# Patient Record
Sex: Female | Born: 1935 | Race: White | Hispanic: No | Marital: Married | Smoking: Never smoker
Health system: Southern US, Community
[De-identification: ages and names within clinical notes are randomized; demographics above are authoritative.]

## PROBLEM LIST (undated history)

## (undated) DIAGNOSIS — E119 Type 2 diabetes mellitus without complications: Secondary | ICD-10-CM

## (undated) DIAGNOSIS — R42 Dizziness and giddiness: Secondary | ICD-10-CM

## (undated) DIAGNOSIS — E7800 Pure hypercholesterolemia, unspecified: Secondary | ICD-10-CM

## (undated) DIAGNOSIS — Z8619 Personal history of other infectious and parasitic diseases: Secondary | ICD-10-CM

## (undated) DIAGNOSIS — F329 Major depressive disorder, single episode, unspecified: Secondary | ICD-10-CM

## (undated) DIAGNOSIS — F411 Generalized anxiety disorder: Secondary | ICD-10-CM

## (undated) DIAGNOSIS — I10 Essential (primary) hypertension: Secondary | ICD-10-CM

## (undated) DIAGNOSIS — I429 Cardiomyopathy, unspecified: Secondary | ICD-10-CM

## (undated) DIAGNOSIS — M1990 Unspecified osteoarthritis, unspecified site: Secondary | ICD-10-CM

## (undated) DIAGNOSIS — M797 Fibromyalgia: Secondary | ICD-10-CM

## (undated) DIAGNOSIS — E669 Obesity, unspecified: Secondary | ICD-10-CM

## (undated) DIAGNOSIS — R053 Chronic cough: Secondary | ICD-10-CM

## (undated) DIAGNOSIS — I340 Nonrheumatic mitral (valve) insufficiency: Secondary | ICD-10-CM

## (undated) DIAGNOSIS — J301 Allergic rhinitis due to pollen: Secondary | ICD-10-CM

## (undated) DIAGNOSIS — I4891 Unspecified atrial fibrillation: Secondary | ICD-10-CM

## (undated) DIAGNOSIS — I509 Heart failure, unspecified: Secondary | ICD-10-CM

## (undated) DIAGNOSIS — M8580 Other specified disorders of bone density and structure, unspecified site: Secondary | ICD-10-CM

## (undated) DIAGNOSIS — H02149 Spastic ectropion of unspecified eye, unspecified eyelid: Secondary | ICD-10-CM

## (undated) DIAGNOSIS — K219 Gastro-esophageal reflux disease without esophagitis: Secondary | ICD-10-CM

## (undated) DIAGNOSIS — M159 Polyosteoarthritis, unspecified: Secondary | ICD-10-CM

## (undated) DIAGNOSIS — R05 Cough: Secondary | ICD-10-CM

## (undated) HISTORY — DX: Obesity, unspecified: E66.9

## (undated) HISTORY — DX: Major depressive disorder, single episode, unspecified: F32.9

## (undated) HISTORY — DX: Pure hypercholesterolemia, unspecified: E78.00

## (undated) HISTORY — DX: Gastro-esophageal reflux disease without esophagitis: K21.9

## (undated) HISTORY — DX: Generalized anxiety disorder: F41.1

## (undated) HISTORY — DX: Type 2 diabetes mellitus without complications: E11.9

## (undated) HISTORY — DX: Heart failure, unspecified: I50.9

## (undated) HISTORY — DX: Cardiomyopathy, unspecified: I42.9

## (undated) HISTORY — DX: Nonrheumatic mitral (valve) insufficiency: I34.0

## (undated) HISTORY — DX: Unspecified osteoarthritis, unspecified site: M19.90

## (undated) HISTORY — DX: Cough: R05

## (undated) HISTORY — PX: TONSILLECTOMY: SUR1361

## (undated) HISTORY — PX: PARTIAL HYSTERECTOMY: SHX80

## (undated) HISTORY — DX: Spastic ectropion of unspecified eye, unspecified eyelid: H02.149

## (undated) HISTORY — DX: Dizziness and giddiness: R42

## (undated) HISTORY — DX: Chronic cough: R05.3

## (undated) HISTORY — DX: Polyosteoarthritis, unspecified: M15.9

## (undated) HISTORY — DX: Other specified disorders of bone density and structure, unspecified site: M85.80

## (undated) HISTORY — DX: Personal history of other infectious and parasitic diseases: Z86.19

## (undated) HISTORY — DX: Allergic rhinitis due to pollen: J30.1

## (undated) HISTORY — DX: Fibromyalgia: M79.7

## (undated) HISTORY — DX: Unspecified atrial fibrillation: I48.91

## (undated) HISTORY — DX: Essential (primary) hypertension: I10

---

## 2000-02-10 ENCOUNTER — Encounter: Payer: Self-pay | Admitting: Family Medicine

## 2000-02-10 ENCOUNTER — Encounter: Admission: RE | Admit: 2000-02-10 | Discharge: 2000-02-10 | Payer: Self-pay | Admitting: Family Medicine

## 2001-08-13 ENCOUNTER — Encounter: Payer: Self-pay | Admitting: Internal Medicine

## 2001-08-13 ENCOUNTER — Encounter: Admission: RE | Admit: 2001-08-13 | Discharge: 2001-08-13 | Payer: Self-pay | Admitting: Internal Medicine

## 2001-10-04 HISTORY — PX: X-STOP IMPLANTATION: SHX2677

## 2001-10-12 ENCOUNTER — Encounter: Payer: Self-pay | Admitting: Specialist

## 2001-10-19 ENCOUNTER — Inpatient Hospital Stay (HOSPITAL_COMMUNITY): Admission: RE | Admit: 2001-10-19 | Discharge: 2001-10-24 | Payer: Self-pay | Admitting: Specialist

## 2001-10-19 ENCOUNTER — Encounter: Payer: Self-pay | Admitting: Specialist

## 2002-10-11 ENCOUNTER — Ambulatory Visit (HOSPITAL_COMMUNITY): Admission: RE | Admit: 2002-10-11 | Discharge: 2002-10-11 | Payer: Self-pay | Admitting: Gastroenterology

## 2003-08-14 ENCOUNTER — Inpatient Hospital Stay (HOSPITAL_COMMUNITY): Admission: RE | Admit: 2003-08-14 | Discharge: 2003-08-16 | Payer: Self-pay | Admitting: Obstetrics and Gynecology

## 2003-08-14 ENCOUNTER — Encounter (INDEPENDENT_AMBULATORY_CARE_PROVIDER_SITE_OTHER): Payer: Self-pay | Admitting: *Deleted

## 2005-06-05 ENCOUNTER — Encounter: Admission: RE | Admit: 2005-06-05 | Discharge: 2005-06-05 | Payer: Self-pay | Admitting: Family Medicine

## 2006-01-21 ENCOUNTER — Encounter: Admission: RE | Admit: 2006-01-21 | Discharge: 2006-02-16 | Payer: Self-pay | Admitting: Orthopedic Surgery

## 2006-02-17 ENCOUNTER — Encounter: Admission: RE | Admit: 2006-02-17 | Discharge: 2006-05-03 | Payer: Self-pay | Admitting: Orthopedic Surgery

## 2006-06-08 ENCOUNTER — Encounter: Admission: RE | Admit: 2006-06-08 | Discharge: 2006-06-08 | Payer: Self-pay | Admitting: Family Medicine

## 2006-06-24 ENCOUNTER — Ambulatory Visit (HOSPITAL_COMMUNITY): Admission: RE | Admit: 2006-06-24 | Discharge: 2006-06-24 | Payer: Self-pay | Admitting: Interventional Cardiology

## 2006-09-23 ENCOUNTER — Ambulatory Visit (HOSPITAL_COMMUNITY): Admission: RE | Admit: 2006-09-23 | Discharge: 2006-09-24 | Payer: Self-pay | Admitting: Orthopedic Surgery

## 2006-09-27 ENCOUNTER — Encounter: Admission: RE | Admit: 2006-09-27 | Discharge: 2006-12-26 | Payer: Self-pay | Admitting: Orthopedic Surgery

## 2007-01-17 ENCOUNTER — Encounter: Admission: RE | Admit: 2007-01-17 | Discharge: 2007-04-06 | Payer: Self-pay | Admitting: Orthopedic Surgery

## 2007-11-28 ENCOUNTER — Encounter: Admission: RE | Admit: 2007-11-28 | Discharge: 2007-11-28 | Payer: Self-pay | Admitting: Family Medicine

## 2008-01-19 ENCOUNTER — Encounter: Admission: RE | Admit: 2008-01-19 | Discharge: 2008-01-19 | Payer: Self-pay | Admitting: Family Medicine

## 2008-02-15 ENCOUNTER — Encounter: Admission: RE | Admit: 2008-02-15 | Discharge: 2008-02-15 | Payer: Self-pay | Admitting: Family Medicine

## 2008-12-19 ENCOUNTER — Encounter: Admission: RE | Admit: 2008-12-19 | Discharge: 2008-12-19 | Payer: Self-pay | Admitting: Family Medicine

## 2009-09-02 IMAGING — CT CT ABDOMEN W/ CM
2 of 5 series · 17 of 46 positions shown, 19 images · IV contrast (READICAT/WATER & [ID] OMNI 300)
Comparison: None

CT ABDOMEN

CLINICAL DATA: Weight loss, nausea, vomiting, decreased appetite

CT ABDOMEN AND PELVIS WITH CONTRAST
TECHNIQUE: Multidetector CT imaging of the abdomen and pelvis was
performed using the standard protocol following bolus
administration of intravenous contrast.
Contrast: 100 ml Cmnipaque-JEE

[Series 3: routine abdomen · axial · 0.79mm/px · z∈[-364,+26]mm · 14 of 86 slices shown, 16 images]
[im 5/86  soft-tissue]
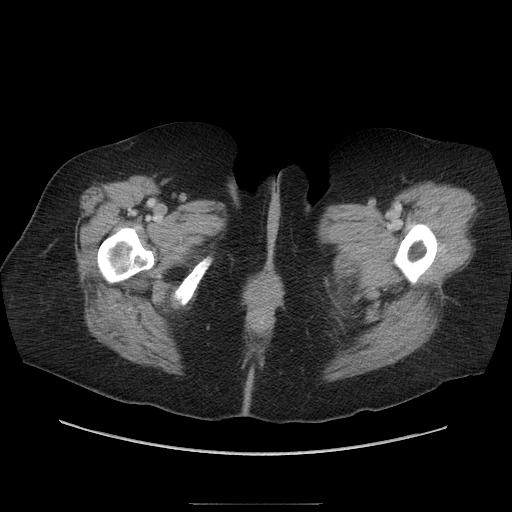
[im 5/86  bone]
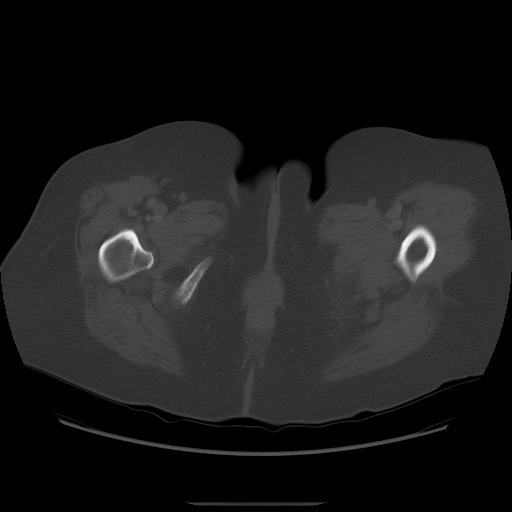
[im 10/86  soft-tissue]
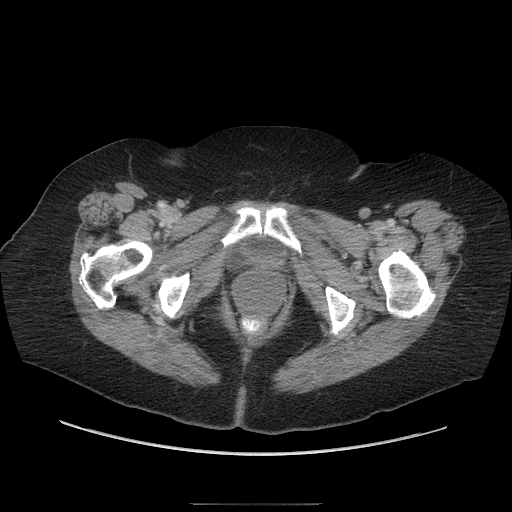
[im 19/86  soft-tissue]
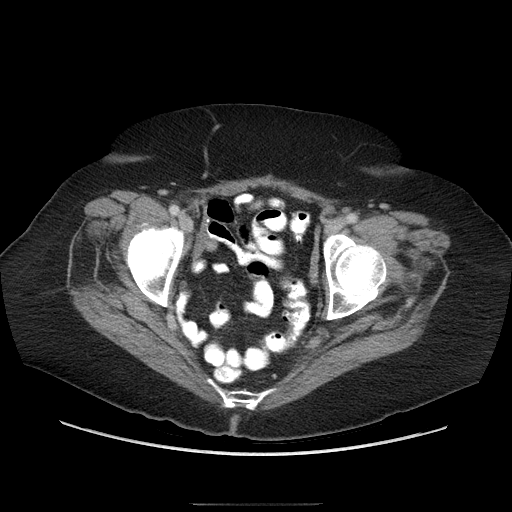
[im 24/86  soft-tissue]
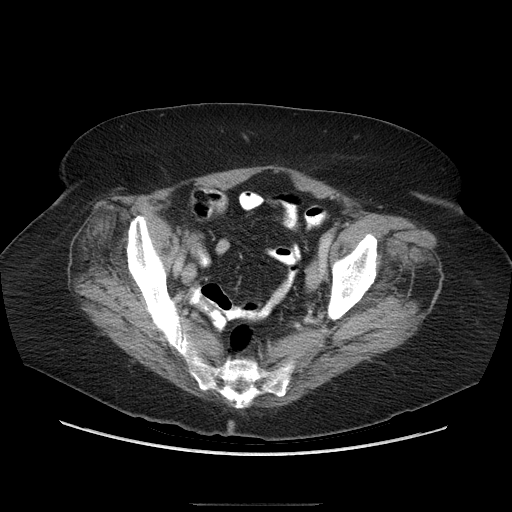
[im 29/86  soft-tissue]
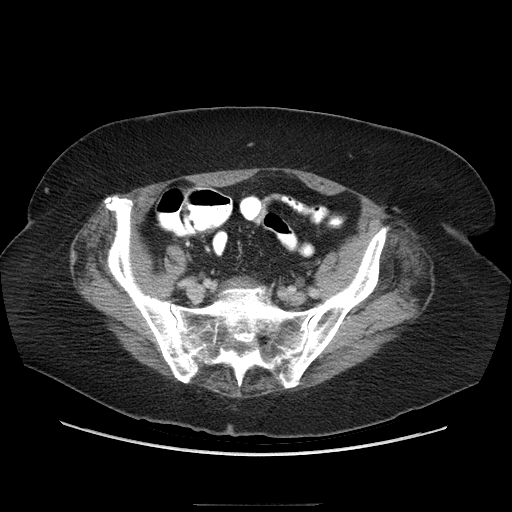
[im 34/86  soft-tissue]
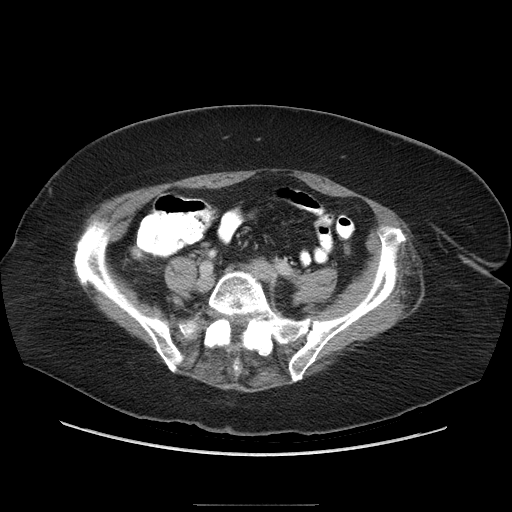
[im 38/86  soft-tissue]
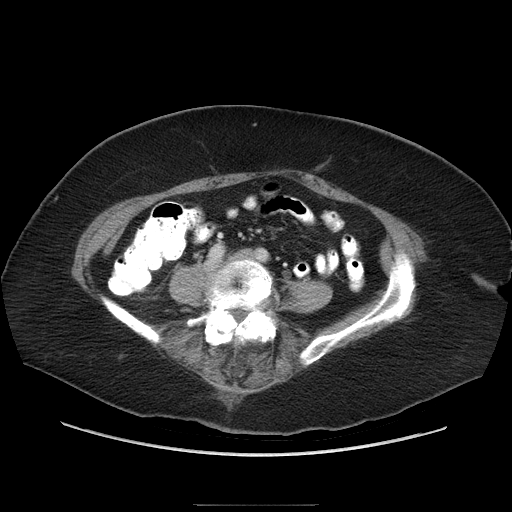
[im 48/86  soft-tissue]
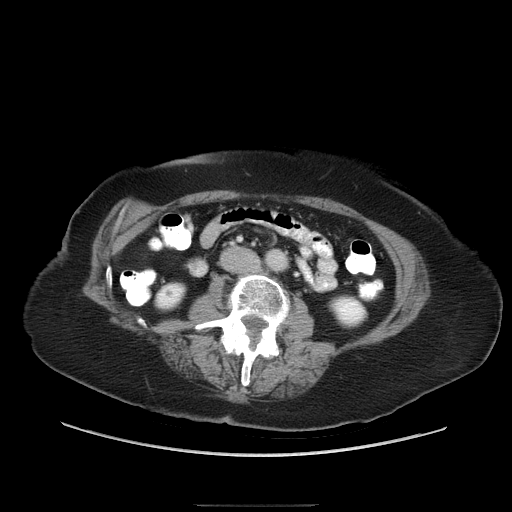
[im 52/86  soft-tissue]
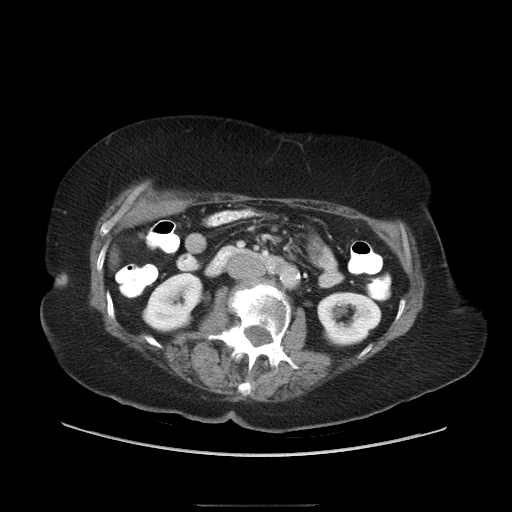
[im 52/86  bone]
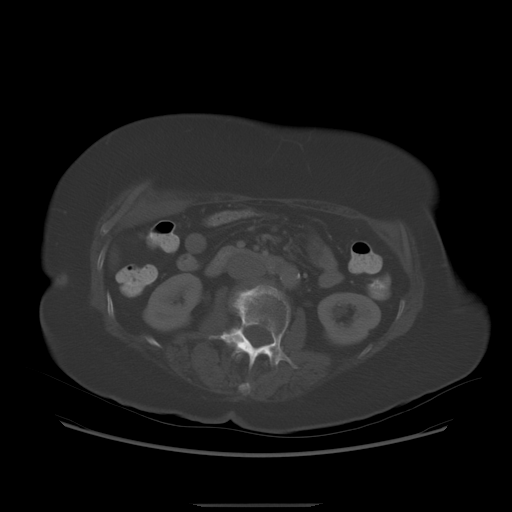
[im 57/86  soft-tissue]
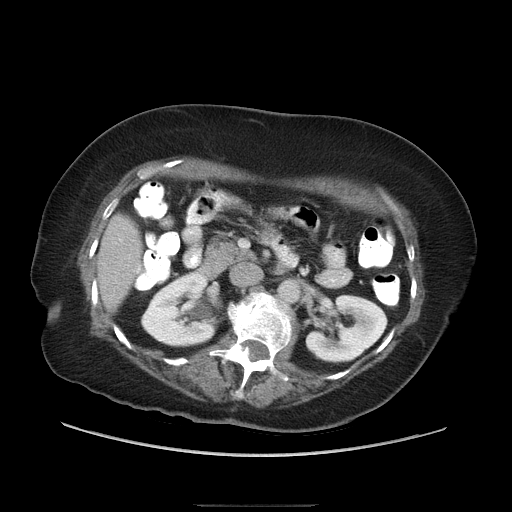
[im 62/86  soft-tissue]
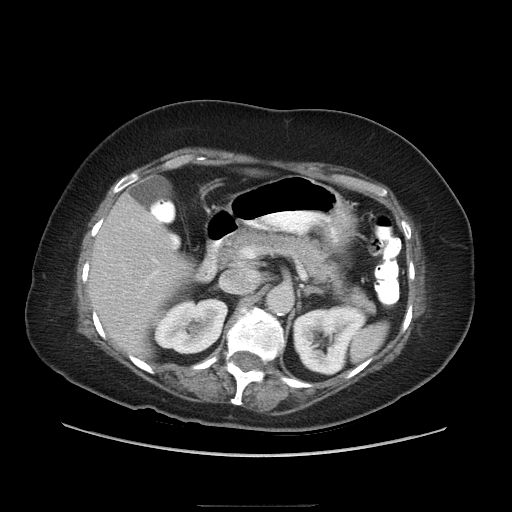
[im 67/86  soft-tissue]
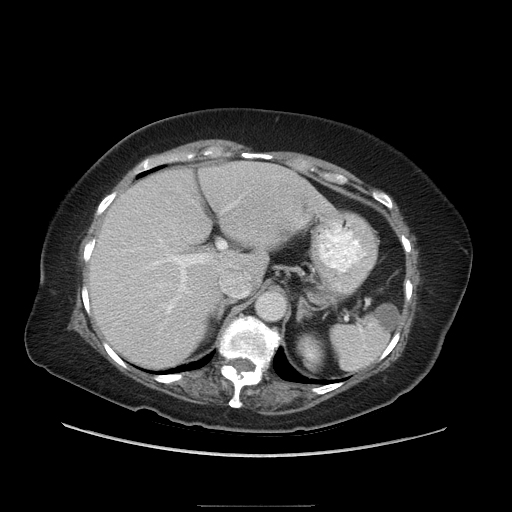
[im 76/86  soft-tissue]
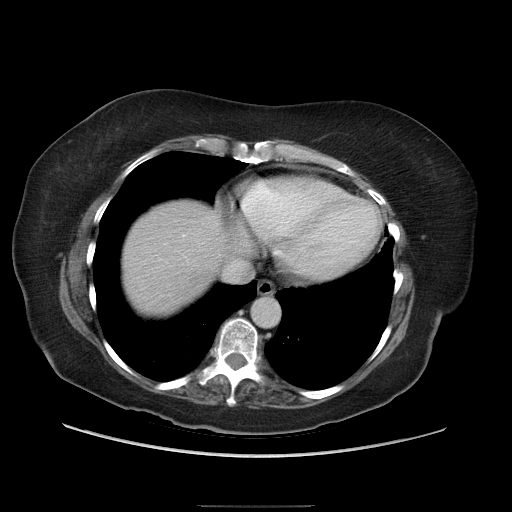
[im 81/86  soft-tissue]
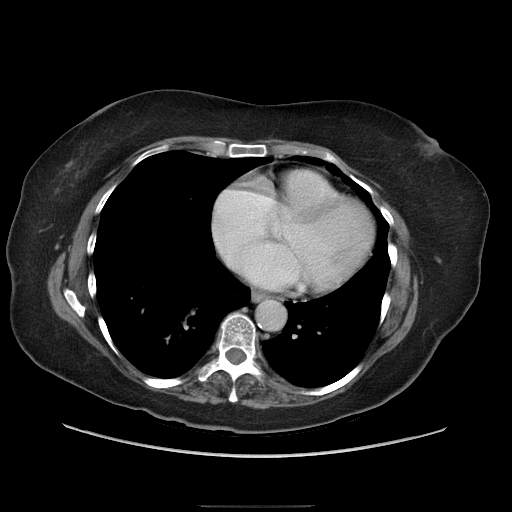

[Series 602: sagittal body · sagittal · 0.89mm/px · 3 of 163 slices shown]
[im 55/163  soft-tissue]
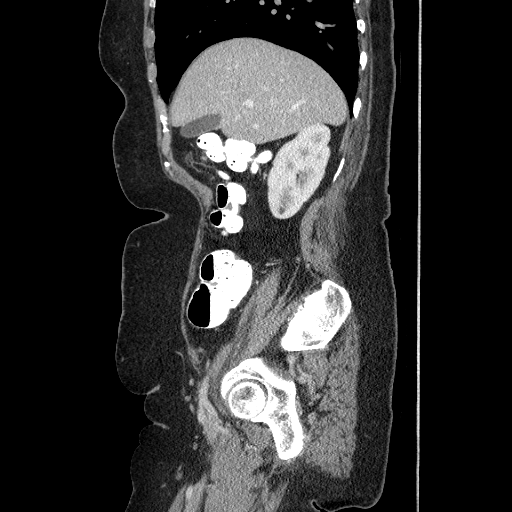
[im 73/163  soft-tissue]
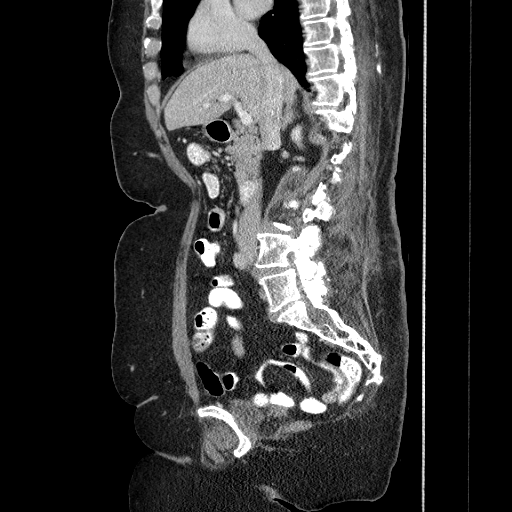
[im 91/163  soft-tissue]
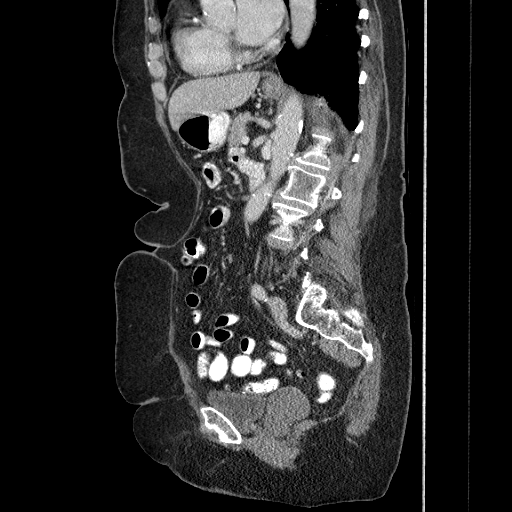

[17 of 46 positions shown; findings below may reference images not displayed]

FINDINGS: The lung bases are clear.  A small slightly dense nodule
is noted deep in the axillary tail of the right breast and
correlation with mammography is recommended.  The liver enhances
with no focal abnormality and no ductal dilatation is seen.  No
calcified gallstones are noted.  The pancreas is normal in size and
the pancreatic duct is not dilated.  The adrenal glands and spleen
appear normal although there is a low attenuation rounded lesion
emanating from the spleen most consistent with benign process such
as complex cyst.  The kidneys enhance and on delayed images the
pelvocaliceal systems appear normal.  Small left parapelvic renal
cysts are noted.  The abdominal aorta is normal in caliber.
IMPRESSION: 1.  No acute abnormality.
2.  Small dense nodule deep in the axillary tail of the right
breast.  Correlate with mammography.
3.  Probable incidental complex splenic cyst.

CT PELVIS
FINDINGS: The uterus is normal in size.  The urinary bladder is
unremarkable.  No pelvic mass or fluid is seen.  There are a few
sigmoid colon diverticula present but no diverticulitis is seen.
The terminal ileum appears normal.  The appendix is not definitely
seen.  Lumbar scoliosis is noted with diffuse degenerative disc
disease.
IMPRESSION: No mass, fluid, or adenopathy.  Diffuse degenerative disc disease
throughout the lumbar spine.  Sigmoid colon diverticula, but no
diverticulitis.

## 2010-08-19 NOTE — Op Note (Signed)
Brooke [name], Brooke [name]               ACCOUNT NO.:  [id number]   MEDICAL RECORD NO.:  [id number]          PATIENT TYPE:  OIB   LOCATION:  5040                         FACILITY:  [facility]   PHYSICIAN:  Almedia Balls. Ranell Patrick, M.D. DATE OF BIRTH:  [birth year]   DATE OF PROCEDURE:  09/23/2006  DATE OF DISCHARGE:                               OPERATIVE REPORT   PREOPERATIVE DIAGNOSES:  1. Right shoulder rotator cuff tear.  2. Right shoulder subscapularis tear.   POSTOPERATIVE DIAGNOSES:  1. Right shoulder rotator cuff tear, chronic.  2. Right shoulder subscapularis tear, chronic.  3. Superior labral tear anterior to posterior with chronic biceps      rupture.  4. Osteoarthritis of glenohumeral joint.   PROCEDURE PERFORMED:  Right shoulder arthroscopy with extensive  intraarticular debridement of torn superior labrum anterior to posterior  with debridement of chronic tearing of the rotator cuff as well as  chondroplasty on the glenohumeral joint.  We did a limited subacromial  bursectomy but no acromioplasty was performed and then we did a mini  open rotator cuff repair, which is partial and a mini open subscapularis  repair, partial.   SURGEON:  Almedia Balls. Ranell Patrick, M.D.   ASSISTANT:  Donnie Coffin. Durwin Nora, P.A.   General anesthesia was used plus interscalene block.   ESTIMATED BLOOD LOSS:  Minimal.   FLUID REPLACEMENT:  1,200 mL crystalloid.   INSTRUMENT COUNTS:  Correct.   COMPLICATIONS:  None.   Preoperative antibiotics were given.   INDICATION:  The patient is a 77 year old female who suffered a fall  onto her right shoulder in a parking garage.  The patient complained of  immediate loss of function and pain in the shoulder.  She has had  minimal complaints of the shoulder prior to this.  On physical  examination, the patient has profound external rotation weakness and MRI  scan demonstrates torn rotator cuff and subscapularis.  The patient  presents now for operative treatment to  attempt to restore function and  continuity of the rotator cuff and subscapularis tendons.  Informed  consent was obtained.   DESCRIPTION OF PROCEDURE:  After an adequate level of anesthesia was  achieved, the patient was positioned supine on the operating room table.  She was brought up in the modified beach chair position.  The right  shoulder was examined under anesthesia, was noted to have excessive  external rotation consistent with subscapularis tear.  She had no  instability in the shoulder.  We sterilely prepped and draped the right  shoulder and then entered the shoulder arthroscopic through standard  arthroscopic portals anterior, posterior and lateral portals.  We  identified quite a bit of damage inside the shoulder including extensive  tearing of the superior labrum.  There was also chronically ruptured  biceps.  We performed a labral debridement using basket forceps and the  motorized shaver.  The patient also had evidence of a full thickness  rotator cuff tear with retraction to the level of the glenoid and the  subscapularis also was torn and I was not sure that we could actually  visualize the subscapularis  through the scope.  Again, the biceps was  torn.  We performed again a labral debridement.  Also some chondroplasty  on the humeral head as there was extensive delamination of cartilage and  full thickness cartilage loss.  We then concluded our glenohumeral  portion of surgery.  Then went in the subacromial space through the  lateral portal identifying again the retracted rotator cuff which did  appear to be mobile somewhat for potential repair.  We performed a  limited bursectomy but did not perform any type of acromioplasty or test  to see the ligament anteriorly.  We then concluded arthroscopy, and went  ahead and made a mini open incision of the raphae between the anterior  and lateral heads of the deltoid.  Dissection carried sharply down  through subcutaneous  tissues.  Split that raphae, identifying the torn  rotator cuff and subscapularis.  We then placed traction sutures in the  end of the tendon, working towards margin convergence and anything we  could do to close down this very large gap, essentially from teres minor  all the way forward to the subscapularis which was off.  There was  prominent greater tuberosity and lesser tuberosity which we rongeured  down to prevent impingement of those structures.  Again, the biceps was  torn and the groove was vacant.  We did identify the subscapularis which  is very, very retracted.  Only the very inferior portion appeared to be  possibly acute.  The remainder of it appeared to be chronic with well  healed over naked subscapularis footprint.  We freshened up the  footprint, placed in traction sutures in the subscapularis, mobilized on  both sides of the tendon and were able to mobilize a little bit of the  bottom of the subscapularis and brought that up and repaired it with a  single 5.5 corkscrew anchor.  The rotator cuff was just absolutely too  retracted and we did a little bit of margin convergence, could not get  any medial to lateral translation but were able to get some posterior to  anterior translation, pulled the cuff forward and repaired using a  single 5.5 bio-cork screw anchor, decreasing the size of the hole by a  little bit but, again, not a very satisfying repair other than what we  could get from back to front.  There still remained a sizable superior  defect.  We are hoping that the small amount of subscapularis and  rotator cuff repair that was done will provide her with the balance  downward head depressing action and would balance with her deltoid and  give her some function back.  We thoroughly irrigated the shoulder,  closed the deltoid to itself with 0 Vicryl suture followed by 2-0 Vicryl  subcutaneous closure and 4-0 Monocryl to skin.  Steri-Strips applied followed by a  sterile dressing.  The patient tolerated surgery well.      Almedia Balls. Ranell Patrick, M.D.  Electronically Signed     SRN/MEDQ  D:  09/23/2006  T:  09/23/2006  Job:  [id number]

## 2010-08-22 NOTE — H&P (Signed)
NAMEHARRIS, Brooke Brooke [name]                         ACCOUNT NO.:  [id number]   MEDICAL RECORD NO.:  [id number]                   PATIENT TYPE:  INP   LOCATION:  NA                                   FACILITY:  [facility]   PHYSICIAN:  Katherine Roan, M.D.               DATE OF BIRTH:  November 26, 1927   DATE OF ADMISSION:  DATE OF DISCHARGE:                                HISTORY & PHYSICAL   CHIEF COMPLAINT:  Pelvic incontinence.   HISTORY OF PRESENT ILLNESS:  Brooke Brooke [name] is Brooke [name] 78 year old female with stress  urinary incontinence, who has been evaluated by Dr. Boston Service and is  felt to need Brooke [name] sling procedure because of the incontinence.  She also had Brooke [name]  bulging rectocele.   She has Brooke [name] past history of Brooke [name] vaginal hysterectomy and Raz urethropexy.   The plan on this admission is to repair the cystocele, remove the Raz, and  do Brooke [name] sling.   CURRENT ALLERGIES:  1. TETRACYCLINE.  2. CELEBREX.  3. BEXTRA.  4. AQUAMYCIN.  5. VICODIN.  6. MELOXICAM.  7. DITROPAN.  8. DETROL.   She has had Brooke [name] history of back surgery and Brooke [name] history of carpal tunnel.   REVIEW OF SYSTEMS:  She wear glasses but notices no decrease in vision or  auditory acuity.  No dizziness.  No frequent headaches.  HEART:  She has  hypertension and is well controlled.  She is on Lotrel and atenolol.  She  denies chest pain.  Denies history of mitral valve prolapse or heart murmur.  LUNGS:  No chronic cough.  No asthma.  She states that early in the morning  she gets up and has Brooke [name] postnasal drip.  GU:  She has stress incontinence and  frequency.  She also has some urge.  GI:  No bowel habit change.  No melena.  She has had Brooke [name] colonoscopy in the past.  MUSCLE/BONES/JOINTS:  No fractures  or arthritis.   FAMILY HISTORY:  Mother died at 69.  Father died at 2.  Her father had lung  cancer.  Mother had Brooke [name] heart attack and died at age 29.   PHYSICAL EXAMINATION:  VITAL SIGNS:  Weight is 156.  Blood pressure 118/78.  GENERAL:  Brooke [name]  well-developed and well-nourished female who appears to be her  stated age.  Is oriented to time, place, and recent events.  HEENT:  Unremarkable.  Oropharynx is not injected.  NECK:  Supple.  Thyroid is not enlarged.  Carotid pulses are equal without  bruits.  Trachea is midline.  Thyroid is not enlarged.  LUNGS:  Clear to P&Brooke [name].  BREASTS:  No masses or tenderness.  HEART:  Normal sinus rhythm.  No heaves, thrills, rubs, or gallops.  ABDOMEN:  Soft and flat.  Liver, spleen, and kidneys are not palpated.  PELVIC:  Vulva and vagina normal.  There is Brooke [name] second-degree cystocele, which  is quite  atrophic, as well as Brooke [name] large rectocele. Hemoccult is negative.  No  pelvic masses are noted.  EXTREMITIES:  Good range of motion.  Equal pulses and reflexes.   IMPRESSION:  Symptomatic cystocele and rectocele with stress incontinence.   PLAN:  Failure reconstruction.  Failure rates, risks and benefits have been  discussed with the patient and husband.                                               Katherine Roan, M.D.    SDM/MEDQ  D:  08/09/2003  T:  08/09/2003  Job:  [id number]

## 2010-08-22 NOTE — Discharge Summary (Signed)
NAMELORALEI, RADCLIFFE                         ACCOUNT NO.:  [id number]   MEDICAL RECORD NO.:  [id number]                   PATIENT TYPE:  INP   LOCATION:  0370                                 FACILITY:  [facility]   PHYSICIAN:  Boston Service, M.D.             DATE OF BIRTH:  [birth year]   DATE OF ADMISSION:  08/14/2003  DATE OF DISCHARGE:  08/16/2003                                 DISCHARGE SUMMARY   Indications, medications, allergies, tobacco, ETOH, past medical history,  social history, physical exam and review of systems were all outlined in Dr.  Lavon Paganini note from Aug 14, 2003.   HOSPITAL COURSE:  The patient underwent anterior, posterior and enterocele  repair with Dr. Elana Alm.  The patient underwent dissection of previous Raz  sutures in the retropubic and suprapubic space, cystoscopy, placement of  right and left ureteral stents and then a 15 x 3 cm pubovaginal sling with  Dr. Wanda Plump. The patient had a pleasantly uneventful postoperative  recovery.  By postoperative day two, she was up ambulating, vital signs  stable, afebrile.  Taking p.o. well, urine output was good, incision was dry  and she was felt to be ready for discharge.   DISCHARGE MEDICATIONS:  Cipro, Phenergan and Vicodin.  Because of her  previous complicated history with bladder surgery x3, a decision made to  leave Foley catheter to straight drain.  Will see the patient back in our  office Tuesday of next week, will remove Foley catheter early Tuesday a.m.,  will see patient in the office Tuesday after lunch. Will remove half of her  skin staples at that time and check a post void residual. The patient  understands to call the office if there is any questions or problems.                                               Boston Service, M.D.    RH/MEDQ  D:  08/16/2003  T:  08/16/2003  Job:  [id number]   cc:   S. Kyra Manges, M.D.  [phone] [address]  [place]  [place] [address]  Fax: [phone]

## 2010-08-22 NOTE — Cardiovascular Report (Signed)
Brooke [name], Brooke [name]               ACCOUNT NO.:  [id number]   MEDICAL RECORD NO.:  [id number]          PATIENT TYPE:  OIB   LOCATION:  2899                         FACILITY:  [facility]   PHYSICIAN:  Corky Crafts, MDDATE OF BIRTH:  [birth year]   DATE OF PROCEDURE:  06/24/2006  DATE OF DISCHARGE:                            CARDIAC CATHETERIZATION   REFERRING PHYSICIAN:  Donia Guiles, M.D.   PROCEDURES PERFORMED:  1. Left heart catheterization.  2. Left ventriculogram.  3. Coronary angiogram.  4. Abdominal aortogram.   OPERATOR:  Corky Crafts, MD   INDICATIONS:  Decreased left ventricular function.   PROCEDURE NARRATIVE:  The risks and benefits of cardiac catheterization  were explained to the patient and informed consent was obtained.  The  patient was brought to the cath lab.  She was prepped and draped in the  usual sterile fashion.  Her right groin was infiltrated with 1%  lidocaine.  A 6-French arterial sheath was placed into her right femoral  artery using the modified Seldinger technique.  Left coronary artery  angiography was performed using a JL-4.0 catheter.  The catheter was  advanced to the vessel ostium under fluoroscopic guidance.  Digital  angiography was performed in multiple projections using hand injection  of contrast.  Right coronary artery angiography was then performed using  a JR-4.0 catheter.  The catheter was advanced to the vessel ostium under  fluoroscopic guidance.  Digital angiography was performed in multiple  projections using hand injection of contrast.  A pigtail catheter was  then inserted and advanced to the ascending aorta.  It was advanced  across the aortic valve under fluoroscopic guidance.  A power injection  of contrast was done in the 30-degree RAO position under continuous  hemodynamic pressure monitoring.  The catheter was pulled back across  the aortic valve.  The catheter was then withdrawn to the level of the  renal  arteries and power injection of contrast was done the AP  projection.  A Star Close was deployed for hemostasis to aid with  patient comfort.   FINDINGS:  The left main was widely patent.  The left circumflex is a  large, codominant vessel with mild irregularities.  The first obtuse  marginal is a large vessel with mild irregularities.  The second obtuse  marginal is a medium-sized vessel.  There is a large ramus with mild  irregularities as well.  The left anterior descending is a large vessel  with minor irregularities.  The first diagonal is medium-sized and  appears angiographically normal.  The right coronary artery is a small,  codominant vessel with minor irregularities.   Left ventriculogram:  There was significant ventricular ectopy noted.  It was difficult to estimate ejection fraction; however, it appeared to  be approximately 50%.   The abdominal aortogram showed no aneurysm.  There were single renal  arteries bilaterally, which were widely patent.   HEMODYNAMICS:  Left ventricular pressure 143/8 with an LVEDP of 12,  aortic pressure of 143/86 with a mean aortic pressure of 113 mmHg.   IMPRESSION:  1. No significant coronary artery disease.  2. No renal artery stenosis.  3. Normal hemodynamics.   RECOMMENDATIONS:  The patient will continue aggressive medical therapy  including an ACE inhibitor and a beta-blocker.  She is euvolemic at this  time.  We will perform blood tests to look into other potential  etiologies of decreased LV function as was evidenced by her  echocardiogram.      Corky Crafts, MD  Electronically Signed     JSV/MEDQ  D:  06/24/2006  T:  06/24/2006  Job:  [id number]

## 2010-12-27 ENCOUNTER — Emergency Department (HOSPITAL_COMMUNITY): Payer: Medicare Other

## 2010-12-27 ENCOUNTER — Emergency Department (HOSPITAL_COMMUNITY)
Admission: EM | Admit: 2010-12-27 | Discharge: 2010-12-27 | Disposition: A | Discharge status: Home / Self Care | Payer: Medicare Other | Attending: Emergency Medicine | Admitting: Emergency Medicine

## 2010-12-27 DIAGNOSIS — Z7982 Long term (current) use of aspirin: Secondary | ICD-10-CM | POA: Insufficient documentation

## 2010-12-27 DIAGNOSIS — I4891 Unspecified atrial fibrillation: Secondary | ICD-10-CM | POA: Insufficient documentation

## 2010-12-27 DIAGNOSIS — S0180XA Unspecified open wound of other part of head, initial encounter: Secondary | ICD-10-CM | POA: Insufficient documentation

## 2010-12-27 DIAGNOSIS — F411 Generalized anxiety disorder: Secondary | ICD-10-CM | POA: Insufficient documentation

## 2010-12-27 DIAGNOSIS — S1093XA Contusion of unspecified part of neck, initial encounter: Secondary | ICD-10-CM | POA: Insufficient documentation

## 2010-12-27 DIAGNOSIS — Y93E8 Activity, other personal hygiene: Secondary | ICD-10-CM | POA: Insufficient documentation

## 2010-12-27 DIAGNOSIS — W010XXA Fall on same level from slipping, tripping and stumbling without subsequent striking against object, initial encounter: Secondary | ICD-10-CM | POA: Insufficient documentation

## 2010-12-27 DIAGNOSIS — I509 Heart failure, unspecified: Secondary | ICD-10-CM | POA: Insufficient documentation

## 2010-12-27 DIAGNOSIS — Y998 Other external cause status: Secondary | ICD-10-CM | POA: Insufficient documentation

## 2010-12-27 DIAGNOSIS — S8000XA Contusion of unspecified knee, initial encounter: Secondary | ICD-10-CM | POA: Insufficient documentation

## 2010-12-27 DIAGNOSIS — Y92009 Unspecified place in unspecified non-institutional (private) residence as the place of occurrence of the external cause: Secondary | ICD-10-CM | POA: Insufficient documentation

## 2010-12-27 DIAGNOSIS — S0003XA Contusion of scalp, initial encounter: Secondary | ICD-10-CM | POA: Insufficient documentation

## 2010-12-27 DIAGNOSIS — Z7901 Long term (current) use of anticoagulants: Secondary | ICD-10-CM | POA: Insufficient documentation

## 2010-12-27 LAB — DIFFERENTIAL
Eosinophils Absolute: 0.9 10*3/uL — ABNORMAL HIGH (ref 0.0–0.7)
Lymphocytes Relative: 15 % (ref 12–46)

## 2010-12-27 LAB — PROTIME-INR: Prothrombin Time: 20.1 seconds — ABNORMAL HIGH (ref 11.6–15.2)

## 2010-12-27 LAB — CBC
HCT: 42.9 % (ref 36.0–46.0)
Hemoglobin: 14.6 g/dL (ref 12.0–15.0)
MCHC: 34 g/dL (ref 30.0–36.0)

## 2010-12-27 LAB — BASIC METABOLIC PANEL
BUN: 8 mg/dL (ref 6–23)
CO2: 32 mEq/L (ref 19–32)
Chloride: 98 mEq/L (ref 96–112)
Creatinine, Ser: 0.55 mg/dL (ref 0.50–1.10)
Glucose, Bld: 122 mg/dL — ABNORMAL HIGH (ref 70–99)

## 2011-01-21 LAB — COMPREHENSIVE METABOLIC PANEL
ALT: 25
AST: 21
Albumin: 3.7
BUN: 10
Chloride: 97
GFR calc Af Amer: >60
GFR calc non Af Amer: >60
Potassium: 4.4
Sodium: 130 — ABNORMAL LOW
Total Bilirubin: 0.9
Total Protein: 6.3

## 2011-01-21 LAB — CBC
Hemoglobin: 14.5
MCV: 93
RBC: 4.62
RDW: 14.4 — ABNORMAL HIGH
WBC: 10.8 — ABNORMAL HIGH

## 2011-01-21 LAB — BASIC METABOLIC PANEL
CO2: 25
Calcium: 8.4
Creatinine, Ser: 0.66
Potassium: 3.5

## 2013-02-13 ENCOUNTER — Encounter: Payer: Self-pay | Admitting: Interventional Cardiology

## 2013-05-02 ENCOUNTER — Ambulatory Visit: Payer: Medicare Other | Admitting: Interventional Cardiology

## 2013-05-19 ENCOUNTER — Ambulatory Visit: Payer: Medicare Other | Admitting: Interventional Cardiology

## 2014-04-04 ENCOUNTER — Encounter: Payer: Self-pay | Admitting: Interventional Cardiology

## 2014-05-04 ENCOUNTER — Emergency Department (HOSPITAL_COMMUNITY): Payer: Medicare Other

## 2014-05-04 ENCOUNTER — Emergency Department (HOSPITAL_COMMUNITY)
Admission: EM | Admit: 2014-05-04 | Discharge: 2014-05-07 | Disposition: E | Payer: Medicare Other | Attending: Emergency Medicine | Admitting: Emergency Medicine

## 2014-05-04 ENCOUNTER — Encounter (HOSPITAL_COMMUNITY): Payer: Self-pay | Admitting: Emergency Medicine

## 2014-05-04 DIAGNOSIS — Z8709 Personal history of other diseases of the respiratory system: Secondary | ICD-10-CM | POA: Insufficient documentation

## 2014-05-04 DIAGNOSIS — I4891 Unspecified atrial fibrillation: Secondary | ICD-10-CM | POA: Insufficient documentation

## 2014-05-04 DIAGNOSIS — I10 Essential (primary) hypertension: Secondary | ICD-10-CM | POA: Insufficient documentation

## 2014-05-04 DIAGNOSIS — Z7901 Long term (current) use of anticoagulants: Secondary | ICD-10-CM | POA: Insufficient documentation

## 2014-05-04 DIAGNOSIS — F329 Major depressive disorder, single episode, unspecified: Secondary | ICD-10-CM | POA: Insufficient documentation

## 2014-05-04 DIAGNOSIS — M1990 Unspecified osteoarthritis, unspecified site: Secondary | ICD-10-CM | POA: Diagnosis not present

## 2014-05-04 DIAGNOSIS — E119 Type 2 diabetes mellitus without complications: Secondary | ICD-10-CM | POA: Insufficient documentation

## 2014-05-04 DIAGNOSIS — I509 Heart failure, unspecified: Secondary | ICD-10-CM | POA: Insufficient documentation

## 2014-05-04 DIAGNOSIS — Z7982 Long term (current) use of aspirin: Secondary | ICD-10-CM | POA: Insufficient documentation

## 2014-05-04 DIAGNOSIS — M797 Fibromyalgia: Secondary | ICD-10-CM | POA: Diagnosis not present

## 2014-05-04 DIAGNOSIS — E669 Obesity, unspecified: Secondary | ICD-10-CM | POA: Insufficient documentation

## 2014-05-04 DIAGNOSIS — E782 Mixed hyperlipidemia: Secondary | ICD-10-CM | POA: Diagnosis not present

## 2014-05-04 DIAGNOSIS — Z79899 Other long term (current) drug therapy: Secondary | ICD-10-CM | POA: Insufficient documentation

## 2014-05-04 DIAGNOSIS — F419 Anxiety disorder, unspecified: Secondary | ICD-10-CM | POA: Diagnosis not present

## 2014-05-04 DIAGNOSIS — Z8619 Personal history of other infectious and parasitic diseases: Secondary | ICD-10-CM | POA: Insufficient documentation

## 2014-05-04 DIAGNOSIS — I639 Cerebral infarction, unspecified: Secondary | ICD-10-CM | POA: Insufficient documentation

## 2014-05-04 DIAGNOSIS — M8580 Other specified disorders of bone density and structure, unspecified site: Secondary | ICD-10-CM | POA: Insufficient documentation

## 2014-05-04 DIAGNOSIS — I619 Nontraumatic intracerebral hemorrhage, unspecified: Secondary | ICD-10-CM | POA: Insufficient documentation

## 2014-05-04 DIAGNOSIS — K219 Gastro-esophageal reflux disease without esophagitis: Secondary | ICD-10-CM | POA: Insufficient documentation

## 2014-05-04 LAB — COMPREHENSIVE METABOLIC PANEL
ALBUMIN: 3.6 g/dL (ref 3.5–5.2)
ALT: 18 U/L (ref 0–35)
AST: 28 U/L (ref 0–37)
Alkaline Phosphatase: 88 U/L (ref 39–117)
Anion gap: 9 (ref 5–15)
BUN: 14 mg/dL (ref 6–23)
CALCIUM: 9.3 mg/dL (ref 8.4–10.5)
CO2: 33 mmol/L — AB (ref 19–32)
Chloride: 96 mmol/L (ref 96–112)
Creatinine, Ser: 0.83 mg/dL (ref 0.50–1.10)
GFR calc Af Amer: 73 mL/min — ABNORMAL LOW (ref >90)
GFR calc non Af Amer: 63 mL/min — ABNORMAL LOW (ref >90)
Glucose, Bld: 204 mg/dL — ABNORMAL HIGH (ref 70–99)
POTASSIUM: 2.9 mmol/L — AB (ref 3.5–5.1)
Sodium: 138 mmol/L (ref 135–145)
Total Bilirubin: 1.4 mg/dL — ABNORMAL HIGH (ref 0.3–1.2)
Total Protein: 6.8 g/dL (ref 6.0–8.3)

## 2014-05-04 LAB — CBC
HCT: 41.1 % (ref 36.0–46.0)
HEMOGLOBIN: 14.1 g/dL (ref 12.0–15.0)
MCH: 29.9 pg (ref 26.0–34.0)
MCHC: 34.3 g/dL (ref 30.0–36.0)
MCV: 87.3 fL (ref 78.0–100.0)
Platelets: 325 10*3/uL (ref 150–400)
RBC: 4.71 MIL/uL (ref 3.87–5.11)
RDW: 15 % (ref 11.5–15.5)
WBC: 15.7 10*3/uL — ABNORMAL HIGH (ref 4.0–10.5)

## 2014-05-04 LAB — DIFFERENTIAL
Basophils Absolute: 0.1 10*3/uL (ref 0.0–0.1)
Basophils Relative: 0 % (ref 0–1)
EOS ABS: 0.5 10*3/uL (ref 0.0–0.7)
Eosinophils Relative: 3 % (ref 0–5)
LYMPHS ABS: 1.8 10*3/uL (ref 0.7–4.0)
LYMPHS PCT: 12 % (ref 12–46)
MONO ABS: 1.3 10*3/uL — AB (ref 0.1–1.0)
MONOS PCT: 8 % (ref 3–12)
NEUTROS PCT: 77 % (ref 43–77)
Neutro Abs: 12 10*3/uL — ABNORMAL HIGH (ref 1.7–7.7)

## 2014-05-04 LAB — I-STAT CHEM 8, ED
BUN: 15 mg/dL (ref 6–23)
CALCIUM ION: 0.97 mmol/L — AB (ref 1.13–1.30)
CHLORIDE: 96 mmol/L (ref 96–112)
Creatinine, Ser: 0.8 mg/dL (ref 0.50–1.10)
GLUCOSE: 208 mg/dL — AB (ref 70–99)
HEMATOCRIT: 43 % (ref 36.0–46.0)
HEMOGLOBIN: 14.6 g/dL (ref 12.0–15.0)
Potassium: 2.8 mmol/L — ABNORMAL LOW (ref 3.5–5.1)
SODIUM: 138 mmol/L (ref 135–145)
TCO2: 27 mmol/L (ref 0–100)

## 2014-05-04 LAB — I-STAT TROPONIN, ED: Troponin i, poc: 0 ng/mL (ref 0.00–0.08)

## 2014-05-04 LAB — ETHANOL

## 2014-05-04 LAB — PROTIME-INR
INR: 2.33 — ABNORMAL HIGH (ref 0.00–1.49)
Prothrombin Time: 25.7 seconds — ABNORMAL HIGH (ref 11.6–15.2)

## 2014-05-04 LAB — CBG MONITORING, ED: GLUCOSE-CAPILLARY: 202 mg/dL — AB (ref 70–99)

## 2014-05-04 LAB — APTT: aPTT: 37 seconds (ref 24–37)

## 2014-05-04 MED ORDER — FENTANYL CITRATE 0.05 MG/ML IJ SOLN: 50.0000 ug | Freq: Once | INTRAMUSCULAR | Status: DC

## 2014-05-04 MED ORDER — PROTHROMBIN COMPLEX CONC HUMAN 500 UNITS IV KIT: 50.0000 [IU]/kg | PACK | Status: DC

## 2014-05-04 MED ORDER — NICARDIPINE HCL IN NACL 20-0.86 MG/200ML-% IV SOLN: 2.5000 mg/h | Freq: Once | INTRAVENOUS | Status: DC

## 2014-05-04 MED ORDER — PROTHROMBIN COMPLEX CONC HUMAN 500 UNITS IV KIT
4914.0000 [IU] | PACK | INTRAVENOUS | Status: DC
  Filled 2014-05-04: qty 197

## 2014-05-07 NOTE — H&P (Signed)
Stroke Consult    Chief Complaint: altered mental status  HPI: Brooke [name] is an 81 y.o. female history of HLD, A fib on Xarelto, DM presents with altered mental status. Per family, her son talked to her at 2200 on 1/28 and she was in her normal state of health. Woke up at 0415 with vomiting, headache and incontinence. Had rapid decline in mental status during transport. 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 (imaging reviewed) Neurosurgery notified and will evaluate patient. KCentra ordered.   Date last known well: 05/03/2014 Time last known well: 2200 tPA Given: no, ICH present  Past Medical History  Diagnosis Date  . Hypercholesteremia     MIXED  . Essential hypertension, benign   . Atrial fibrillation   . Allergic rhinitis due to pollen   . Esophageal reflux   . Spastic ectropion     ?? anxiety related  . Anxiety state, unspecified   . Generalized osteoarthrosis, unspecified site   . Osteoarthritis   . Major depression     Continues to improve  . Fibromyalgia   . Osteopenia   . Obesity   . GERD (gastroesophageal reflux disease)   . Chronic cough     x 20-30 years from anxiety  . History of shingles   . Diabetes mellitus type 2, controlled     AODM Dx 01/2012 by A1C  . MR (mitral regurgitation)     severe  . CHF (congestive heart failure)   . Dizziness     While getting up from a sitting position  . Cardiomyopathy     LVEF 25-30% in 2011    Past Surgical History  Procedure Laterality Date  . Tonsillectomy    . Partial hysterectomy    . X-stop implantation  10/2001    Family History  Problem Relation Age of Onset  . CAD Mother   . Cancer Father     lung   Social History:  reports that she has never smoked. She does not have any smokeless tobacco history on file. Her alcohol and drug histories are not on file.  Allergies:  Allergies  Allergen  Reactions  . Lisinopril Cough  . Norco [Hydrocodone-Acetaminophen] Nausea Only  . Tetracyclines & Related      (Not in a hospital admission)  ROS: Out of a complete 14 system review, the patient complains of only the following symptoms, and all other reviewed systems are negative. Unable to obtain due to mental status  Physical Examination: Filed Vitals:   04-14-2014 0550  BP: 166/111  Pulse: 90  Resp: 18   Physical Exam  Constitutional: He appears well-developed and well-nourished.  Psych: Affect appropriate to situation Eyes: No scleral injection HENT: No OP obstrucion Head: Normocephalic.  Cardiovascular: Normal rate and regular rhythm.  Respiratory: Effort normal and breath sounds normal.  GI: Soft. Bowel sounds are normal. No distension. There is no tenderness.  Skin: WDI   Neurologic Examination: Mental Status: Obtunded, non-verbal, not following commands Cranial Nerves: II: unable to visualize fundi, no blink to threat bilat, pupils equal, round, reactive to light  III,IV, VI: ptosis not present, eyes midline, dolls eyes absent V,VII: face symmetric VIII: unable to obtain IX,X: gag reflex present XI: unable to obtain XII: unable to obtain Motor: No spontaneous movement, minimal extremity withdrawal to noxious stimuli in RUE Sensory: withdrawal to noxious stimuli in RUE, no  withdrawal in LUE. ? Triple flexion in bilateral LE Plantars: Right: upgoing   Left: upgoing Cerebellar: Unable to test Gait: unable to test  Laboratory Studies:   Basic Metabolic Panel: No results for input(s): NA, K, CL, CO2, GLUCOSE, BUN, CREATININE, CALCIUM, MG, PHOS in the last 168 hours.  Liver Function Tests: No results for input(s): AST, ALT, ALKPHOS, BILITOT, PROT, ALBUMIN in the last 168 hours. No results for input(s): LIPASE, AMYLASE in the last 168 hours. No results for input(s): AMMONIA in the last 168 hours.  CBC: No results for input(s): WBC, NEUTROABS, HGB,  HCT, MCV, PLT in the last 168 hours.  Cardiac Enzymes: No results for input(s): CKTOTAL, CKMB, CKMBINDEX, TROPONINI in the last 168 hours.  BNP: Invalid input(s): POCBNP  CBG: No results for input(s): GLUCAP in the last 168 hours.  Microbiology: No results found for this or any previous visit.  Coagulation Studies: No results for input(s): LABPROT, INR in the last 72 hours.  Urinalysis: No results for input(s): COLORURINE, LABSPEC, PHURINE, GLUCOSEU, HGBUR, BILIRUBINUR, KETONESUR, PROTEINUR, UROBILINOGEN, NITRITE, LEUKOCYTESUR in the last 168 hours.  Invalid input(s): APPERANCEUR  Lipid Panel:  No results found for: CHOL, TRIG, HDL, CHOLHDL, VLDL, LDLCALC  HgbA1C: No results found for: HGBA1C  Urine Drug Screen:  No results found for: LABOPIA, COCAINSCRNUR, LABBENZ, AMPHETMU, THCU, LABBARB  Alcohol Level: No results for input(s): ETH in the last 168 hours.   Imaging: No results found.  Assessment: 81 y.o. female hx of HLD, A fib on Xarelto, DM presenting with acute decline in mental status. Found to have a large cerebellar infarct on CT with mass effect. KCentra ordered in ED. Patient evaluated by neurosurgery, no surgical intervention. Discussed results with family. Unfortunately a poor prognosis.   Stroke Risk Factors - A fib, HLD, HTN  Plan: 1) Patient made comfort care per discussion with family  Elspeth Cho, DO [facility] [phone]  If 7pm- 7am, please page neurology on call as listed in AMION. 05/25/2014, 5:54 AM

## 2014-05-07 NOTE — ED Notes (Signed)
Pt from home was last seen normal at 11pm per son and husband. Pt woke up at 0415am vomiting with headache, and incontinence of stool. Pt was weak, clammy and talking slow, became more lethargic en route. Pt received 4 of zofran en route. Pt answered questions appropriately. No hx of stroke. Pt on xarelto. Hx of A-fib and HTN.

## 2014-05-07 NOTE — Progress Notes (Signed)
Chaplain responded to call from ED to support family of pt actively dying. Pt son, daughter, and spouse at bedside. Chaplain offered emotional support and prayer. Pt family reported that pt was a woman of faith, and they find comfort in knowing she is "in a better place." Pt spouse very teary, but supported by his children. Family has left the ED at this time. Chaplain informed pt nurse of next of kin and funeral home [employer] Chaplain signing off.   2014/12/20 0700  Clinical Encounter Type  Visited With Patient and family together;Health care provider  Visit Type Social support;Death  Referral From Nurse  Spiritual Encounters  Spiritual Needs Emotional;Prayer;Grief support  Stress Factors  Family Stress Factors Loss  Takenya Travaglini, Mayer MaskerCourtney F, Chaplain 01-24-2015 7:55 AM

## 2014-05-07 NOTE — ED Provider Notes (Addendum)
CSN: [id number]     Arrival date & time October 17, 2014  0533 History   First MD Initiated Contact with Patient 0July 13, 2016 [phone]     Chief Complaint  Patient presents with  . Code Stroke     (Consider location/radiation/quality/duration/timing/severity/associated sxs/prior Treatment) HPI Felicie Morneggy A Calcaterra is a 81 y.o. female with past medical history of atrial fibrillation on Xarelto coming in with altered mental status. Symptoms began around 11 PM per the husband. Patient became complaining of severe headache and began vomiting. Patient was altered at this time and not responding normally. Patient is unable to provide her own history due to the acuity of her condition.   Past Medical History  Diagnosis Date  . Hypercholesteremia     MIXED  . Essential hypertension, benign   . Atrial fibrillation   . Allergic rhinitis due to pollen   . Esophageal reflux   . Spastic ectropion     ?? anxiety related  . Anxiety state, unspecified   . Generalized osteoarthrosis, unspecified site   . Osteoarthritis   . Major depression     Continues to improve  . Fibromyalgia   . Osteopenia   . Obesity   . GERD (gastroesophageal reflux disease)   . Chronic cough     x 20-30 years from anxiety  . History of shingles   . Diabetes mellitus type 2, controlled     AODM Dx 01/2012 by A1C  . MR (mitral regurgitation)     severe  . CHF (congestive heart failure)   . Dizziness     While getting up from a sitting position  . Cardiomyopathy     LVEF 25-30% in 2011   Past Surgical History  Procedure Laterality Date  . Tonsillectomy    . Partial hysterectomy    . X-stop implantation  10/2001   Family History  Problem Relation Age of Onset  . CAD Mother   . Cancer Father     lung   History  Substance Use Topics  . Smoking status: Never Smoker   . Smokeless tobacco: Not on file  . Alcohol Use: Not on file   OB History    No data available     Review of Systems  Unable to perform ROS: Mental status  change      Allergies  Lisinopril; Norco; and Tetracyclines & related  Home Medications   Prior to Admission medications   Medication Sig Start Date End Date Taking? Authorizing Provider  aspirin EC 81 MG tablet Take 81 mg by mouth daily.   Yes Historical Provider, MD  Calcium Citrate-Vitamin D (CITRACAL/VITAMIN D) 250-200 MG-UNIT TABS Take 1 tablet by mouth 2 (two) times daily.   Yes Historical Provider, MD  carvedilol (COREG) 12.5 MG tablet Take 12.5 mg by mouth 2 (two) times daily with a meal.   Yes Historical Provider, MD  digoxin (LANOXIN) 0.125 MG tablet Take 0.125 mg by mouth daily.   Yes Historical Provider, MD  furosemide (LASIX) 40 MG tablet Take 1 1/2 tablets in the morning and take 1 tablet in the evening   Yes Historical Provider, MD  ibuprofen (ADVIL,MOTRIN) 200 MG tablet Take 200 mg by mouth as needed.   Yes Historical Provider, MD  LORazepam (ATIVAN) 0.5 MG tablet Take 0.5-1 mg by mouth 2 (two) times daily as needed for anxiety.   Yes Historical Provider, MD  losartan (COZAAR) 100 MG tablet Take 100 mg by mouth daily.   Yes Historical Provider, MD  pantoprazole (PROTONIX) 40  MG tablet Take 40 mg by mouth daily.   Yes Historical Provider, MD  rivaroxaban (XARELTO) 20 MG TABS tablet Take 20 mg by mouth daily with supper.   Yes Historical Provider, MD  sertraline (ZOLOFT) 100 MG tablet Take 100 mg by mouth daily.   Yes Historical Provider, MD  traMADol (ULTRAM) 50 MG tablet Take 50 mg by mouth every 6 (six) hours as needed for moderate pain.   Yes Historical Provider, MD  atorvastatin (LIPITOR) 10 MG tablet Take 10 mg by mouth daily.    Historical Provider, MD  HYDROcodone-acetaminophen (NORCO/VICODIN) 5-325 MG per tablet Take 1 tablet by mouth every 8 (eight) hours as needed for moderate pain.    Historical Provider, MD  warfarin (COUMADIN) 5 MG tablet Take 5 mg by mouth daily.    Historical Provider, MD   BP 183/107 mmHg  Pulse 79  Resp 18  Wt 237 lb (107.502 kg)  SpO2  95% Physical Exam  Constitutional: She appears well-developed and well-nourished. She appears distressed.  HENT:  Head: Normocephalic and atraumatic.  Nose: Nose normal.  Mouth/Throat: Oropharynx is clear and moist. No oropharyngeal exudate.  Eyes: Conjunctivae and EOM are normal. Pupils are equal, round, and reactive to light. No scleral icterus.  Neck: Normal range of motion. No JVD present. No tracheal deviation present. No thyromegaly present.  Cardiovascular: Normal rate, regular rhythm and normal heart sounds.  Exam reveals no gallop and no friction rub.   No murmur heard. Pulmonary/Chest: Effort normal and breath sounds normal. No respiratory distress. She has no wheezes. She exhibits no tenderness.  Abdominal: Soft. Bowel sounds are normal. She exhibits no distension and no mass. There is no tenderness. There is no rebound and no guarding.  Musculoskeletal: Normal range of motion. She exhibits no edema or tenderness.  Lymphadenopathy:    She has no cervical adenopathy.  Neurological: She is alert.  Patient is able to answer yes or no questions. Patient does follow my commands by squeezing my fingers and wiggling her toes. Patient appears to have left-sided weakness. Patient does not cooperate with cranial nerve exam.  Skin: Skin is warm and dry. No rash noted. No erythema. No pallor.  Nursing note and vitals reviewed.   ED Course  Procedures (including critical care time) Labs Review Labs Reviewed  PROTIME-INR - Abnormal; Notable for the following:    Prothrombin Time 25.7 (*)    INR 2.33 (*)    All other components within normal limits  CBC - Abnormal; Notable for the following:    WBC 15.7 (*)    All other components within normal limits  DIFFERENTIAL - Abnormal; Notable for the following:    Neutro Abs 12.0 (*)    Monocytes Absolute 1.3 (*)    All other components within normal limits  COMPREHENSIVE METABOLIC PANEL - Abnormal; Notable for the following:    Potassium  2.9 (*)    CO2 33 (*)    Glucose, Bld 204 (*)    Total Bilirubin 1.4 (*)    GFR calc non Af Amer 63 (*)    GFR calc Af Amer 73 (*)    All other components within normal limits  CBG MONITORING, ED - Abnormal; Notable for the following:    Glucose-Capillary 202 (*)    All other components within normal limits  I-STAT CHEM 8, ED - Abnormal; Notable for the following:    Potassium 2.8 (*)    Glucose, Bld 208 (*)    Calcium, Ion 0.97 (*)  All other components within normal limits  APTT  ETHANOL  URINE RAPID DRUG SCREEN (HOSP PERFORMED)  URINALYSIS, ROUTINE W REFLEX MICROSCOPIC  I-STAT TROPOININ, ED    Imaging Review Ct Head (brain) Wo Contrast  05-15-2014   CLINICAL DATA:  Code stroke. Altered mental status. Severe headache and vomiting.  EXAM: CT HEAD WITHOUT CONTRAST  TECHNIQUE: Contiguous axial images were obtained from the base of the skull through the vertex without intravenous contrast.  COMPARISON:  12/27/2010  FINDINGS: There is an acute intraparenchymal hematoma the appears to arise from the left inferior cerebellum, causing mass effect with effacement of the fourth ventricle and effacement and displacement of the brainstem anteriorly as well as the right cerebellum laterally. There is herniation into the foramen magnum. The hematoma measures about 4.1 x 4.8 cm diameter. No subarachnoid, subdural, or intraventricular hemorrhage is demonstrated.  Diffuse cerebral atrophy. Low-attenuation changes in the deep white matter consistent with small vessel ischemic change. Ventricular dilatation consistent with central atrophy. Gray-white matter junctions are distinct. Basal cisterns are effaced, likely due to mass effect from the cerebellar hematoma. Calvarium appears intact. Visualized paranasal sinuses and mastoid air cells are not opacified.  IMPRESSION: Acute intraparenchymal hematoma arising from the left inferior cerebellum with significant mass effect, including effacement and  displacement of the fourth ventricle, brainstem, right cerebellum, basal cisterns, and with herniation into the foramen magnum.  These results were called by telephone at the time of interpretation on May 15, 2014 at 5:48 am to Dr. Tomasita Crumble , who verbally acknowledged these results.   Electronically Signed   By: Burman Nieves M.D.   On: 2014/05/15 05:52     EKG Interpretation   Date/Time:  Friday May 15, 2014 05:53:31 EST Ventricular Rate:  74 PR Interval:    QRS Duration: 96 QT Interval:  387 QTC Calculation: 429 R Axis:   76 Text Interpretation:  Atrial fibrillation Confirmed by Erroll Luna [phone]) on 15-May-2014 7:01:03 AM      MDM   Final diagnoses:  Hemorrhagic stroke    Patient presents emergency department for altered mental status. Code stroke was immediately  Called upon arrival to the emergency department. CT scan reveals large hemorrhagic stroke with mild compression of the fourth ventricle. Neurology is at bedside evaluating the patient, I spoke with neurosurgery who will come to emergency department for evaluation. Patient was ordered 4 factor and the nicardipine for treatment of her stroke. Of note, the patient is on Xarelto.  Patient status upon my repeated evaluation is again to deteriorate. Her eyes are now closed, she no longer follows commands, her breathing is becoming more labored. I had a discussion with the family regarding intubation, they are currently discussing the situation. I do have concern that the patient will not come off the ventilator if she is intubated, this is expressed with the family.  CRITICAL CARE Performed by: Tomasita Crumble   Total critical care time: .  Critical care time was exclusive of separately billable procedures and treating other patients.  Critical care was necessary to treat or prevent imminent or life-threatening deterioration.  Critical care was time spent personally by me on the following activities:  development of treatment plan with patient and/or surrogate as well as nursing, discussions with consultants, evaluation of patient's response to treatment, examination of patient, obtaining history from patient or surrogate, ordering and performing treatments and interventions, ordering and review of laboratory studies, ordering and review of radiographic studies, pulse oximetry and re-evaluation of patient's condition.  Tomasita Crumble, MD 05-14-14 0700  Tomasita Crumble, MD 2014/05/14 1610

## 2014-05-07 NOTE — ED Notes (Signed)
Chaplain at bedside

## 2014-05-07 NOTE — ED Notes (Signed)
Neurosurgeon at bedside with family.

## 2014-05-07 NOTE — ED Provider Notes (Signed)
7:20 AM  Pt is a 81 y.o. female with history of atrial fibrillation on Xarelto who has a hemorrhagic stroke. Patient has quickly decompensated. Neurology at bedside. Family has agreed to make patient DO NOT RESUSCITATE/DO NOT INTUBATE and comfort care. Nursing staff called me into the room patient's heart rate is in the 140s, sats 34% on nonrebreather.  Chaplain at bedside. Updated family, answered questions, provided support.   7:32 AM  Pt unresponsive, no spontaneous respirations, no pulse, auscultated lungs and heart sounds and there were none present.  Time of death called at 7:32 AM. This would not be a medical examiner case. PCP is Dr. Cam HaiKimberly Shaw. Family at bedside.   8:08 AM  Spoke with Dr. Blair Heysobert Ehinger who will update Dr. Clelia CroftShaw and they will complete the death certificate.  Layla MawKristen N Elgene Coral, DO 2014/06/26 [phone]

## 2014-05-07 NOTE — ED Notes (Signed)
Pt CBG was 202. Informed RN.

## 2014-05-07 NOTE — Consult Note (Signed)
Reason for Consult: Cerebellar hemorrhage Referring Physician: EDP  Brooke [name] is an 81 y.o. female.   HPI:  81 year old female with a history of April fibrillation taking Xarelto who was in her usual state of health until about 4:15 this morning. She describes acute onset of headache with nausea and vomiting. She was brought to the emergency department where she had early change in mental status and had a CT scan which showed a large cerebellar hemorrhage. Neurosurgical evaluation was requested. She has deteriorated rapidly while in the emergency department. She has been evaluated by neurology. She is unable to cooperate with history and physical. I have spoken at length with her family.  Past Medical History  Diagnosis Date  . Hypercholesteremia     MIXED  . Essential hypertension, benign   . Atrial fibrillation   . Allergic rhinitis due to pollen   . Esophageal reflux   . Spastic ectropion     ?? anxiety related  . Anxiety state, unspecified   . Generalized osteoarthrosis, unspecified site   . Osteoarthritis   . Major depression     Continues to improve  . Fibromyalgia   . Osteopenia   . Obesity   . GERD (gastroesophageal reflux disease)   . Chronic cough     x 20-30 years from anxiety  . History of shingles   . Diabetes mellitus type 2, controlled     AODM Dx 01/2012 by A1C  . MR (mitral regurgitation)     severe  . CHF (congestive heart failure)   . Dizziness     While getting up from a sitting position  . Cardiomyopathy     LVEF 25-30% in 2011    Past Surgical History  Procedure Laterality Date  . Tonsillectomy    . Partial hysterectomy    . X-stop implantation  10/2001    Allergies  Allergen Reactions  . Lisinopril Cough  . Norco [Hydrocodone-Acetaminophen] Nausea Only  . Tetracyclines & Related     History  Substance Use Topics  . Smoking status: Never Smoker   . Smokeless tobacco: Not on file  . Alcohol Use: Not on file    Family History   Problem Relation Age of Onset  . CAD Mother   . Cancer Father     lung     Review of Systems  Positive ROS: Unable to obtain  All other systems have been reviewed and were otherwise negative with the exception of those mentioned in the HPI and as above.  Objective: Vital signs in last 24 hours: Pulse Rate:  [77-90] 79 (01/29 0630) Resp:  [18-20] 18 (01/29 0630) BP: (166-183)/(97-111) 183/107 mmHg (01/29 0630) SpO2:  [92 %-95 %] 95 % (01/29 0630) Weight:  [237 lb (107.502 kg)] 237 lb (107.502 kg) (01/29 0550)  General Appearance: Elderly white female who is unresponsive Head: Normocephalic, without obvious abnormality, atraumatic Eyes: Pupils 3 mm and unreactive     Throat: benign Neck: Supple, symmetrical, trachea midline   NEUROLOGIC:   Mental status: GCS E1V1M4, sonorous respirations that are becoming more shallow and less frequent Motor Exam - minimal movement to deep noxious stimuli but there is some brief flexion response on the left more than the right Sensory Exam - unable to test Reflexes: symmetric, no pathologic reflexes, No Hoffman's, No clonus Coordination - unable to test Gait - unable to test Balance - unable to test Cranial Nerves: I: smell Not tested  II: visual acuity  OS: na    OD:  na  II: visual fields  unable to test   II: pupils  3 mm and unreactive   III,VII: ptosis  unable to test   III,IV,VI: extraocular muscles   unable to test   V: mastication  unable to test   V: facial light touch sensation    V,VII: corneal reflex   absent   VII: facial muscle function - upper   unable to test   VII: facial muscle function - lower  unable to test   VIII: hearing Not tested  IX: soft palate elevation   unable to test   IX,X: gag reflex  weak   XI: trapezius strength   unable to test   XI: sternocleidomastoid strength   XI: neck flexion strength    XII: tongue strength   unable to test     Data Review Lab Results  Component Value Date   WBC  15.7* 2014/05/17   HGB 14.6 May 17, 2014   HCT 43.0 05/17/14   MCV 87.3 05-17-14   PLT 325 2014/05/17   Lab Results  Component Value Date   NA 138 05/17/14   K 2.8* 17-May-2014   CL 96 May 17, 2014   CO2 33* 2014/05/17   BUN 15 May 17, 2014   CREATININE 0.80 May 17, 2014   GLUCOSE 208* May 17, 2014   Lab Results  Component Value Date   INR 2.33* 17-May-2014    Radiology: Ct Head (brain) Wo Contrast  05/17/14   CLINICAL DATA:  Code stroke. Altered mental status. Severe headache and vomiting.  EXAM: CT HEAD WITHOUT CONTRAST  TECHNIQUE: Contiguous axial images were obtained from the base of the skull through the vertex without intravenous contrast.  COMPARISON:  12/27/2010  FINDINGS: There is an acute intraparenchymal hematoma the appears to arise from the left inferior cerebellum, causing mass effect with effacement of the fourth ventricle and effacement and displacement of the brainstem anteriorly as well as the right cerebellum laterally. There is herniation into the foramen magnum. The hematoma measures about 4.1 x 4.8 cm diameter. No subarachnoid, subdural, or intraventricular hemorrhage is demonstrated.  Diffuse cerebral atrophy. Low-attenuation changes in the deep white matter consistent with small vessel ischemic change. Ventricular dilatation consistent with central atrophy. Gray-white matter junctions are distinct. Basal cisterns are effaced, likely due to mass effect from the cerebellar hematoma. Calvarium appears intact. Visualized paranasal sinuses and mastoid air cells are not opacified.  IMPRESSION: Acute intraparenchymal hematoma arising from the left inferior cerebellum with significant mass effect, including effacement and displacement of the fourth ventricle, brainstem, right cerebellum, basal cisterns, and with herniation into the foramen magnum.  These results were called by telephone at the time of interpretation on 05/17/14 at 5:48 am to Dr. Tomasita Crumble , who verbally  acknowledged these results.   Electronically Signed   By: Burman Nieves M.D.   On: May 17, 2014 05:52     Assessment/Plan: 81 year old female with cardiomyopathy and low ejection fraction and atrial fibrillation who is on oral anticoagulants (Xarelto) with rapid deterioration of mental status since being in the emergency department in [place] to a large cerebellar hemorrhage with herniation into the foramen magnum and tentorium but no hydrocephalus. At this point I do not think that surgery would likely lead to a meaningful outcome. I think surgery would be futile. I think it would be difficult to control hemorrhage during the surgery and I do not believe that it would lead to a reasonably functional outcome. I have gone over the imaging with the family and discussed potential outcomes. We  have decided against aggressive surgical intervention. I think comfort measures are most appropriate. I have spoken with the hospital neurologist and he agrees. The family is also in agreement.   Lilja Soland S May 24, 2014 7:13 AM

## 2014-05-07 NOTE — ED Notes (Signed)
CODE STROKE PAGED 

## 2014-05-07 DEATH — deceased
